# Patient Record
Sex: Male | Born: 1950
Health system: Southern US, Community
[De-identification: ages and names within clinical notes are randomized; demographics above are authoritative.]

---

## 2017-02-27 ENCOUNTER — Observation Stay (HOSPITAL_COMMUNITY)
Admission: EM | Admit: 2017-02-27 | Discharge: 2017-02-28 | Disposition: A | Discharge status: Home / Self Care | Payer: Medicare Other | Attending: Family Medicine | Admitting: Family Medicine

## 2017-02-27 ENCOUNTER — Encounter (HOSPITAL_COMMUNITY): Payer: Self-pay

## 2017-02-27 DIAGNOSIS — R55 Syncope and collapse: Secondary | ICD-10-CM | POA: Diagnosis present

## 2017-02-27 DIAGNOSIS — Y92410 Unspecified street and highway as the place of occurrence of the external cause: Secondary | ICD-10-CM | POA: Insufficient documentation

## 2017-02-27 DIAGNOSIS — R740 Nonspecific elevation of levels of transaminase and lactic acid dehydrogenase [LDH]: Secondary | ICD-10-CM | POA: Diagnosis not present

## 2017-02-27 DIAGNOSIS — F17210 Nicotine dependence, cigarettes, uncomplicated: Secondary | ICD-10-CM | POA: Diagnosis not present

## 2017-02-27 DIAGNOSIS — Y9389 Activity, other specified: Secondary | ICD-10-CM | POA: Insufficient documentation

## 2017-02-27 DIAGNOSIS — Y999 Unspecified external cause status: Secondary | ICD-10-CM | POA: Insufficient documentation

## 2017-02-27 DIAGNOSIS — Z5181 Encounter for therapeutic drug level monitoring: Secondary | ICD-10-CM | POA: Insufficient documentation

## 2017-02-27 DIAGNOSIS — R7989 Other specified abnormal findings of blood chemistry: Secondary | ICD-10-CM

## 2017-02-27 DIAGNOSIS — F1910 Other psychoactive substance abuse, uncomplicated: Secondary | ICD-10-CM | POA: Insufficient documentation
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
## 2017-02-28 ENCOUNTER — Observation Stay (HOSPITAL_COMMUNITY)
Admit: 2017-02-28 | Discharge: 2017-02-28 | Disposition: A | Discharge status: Home / Self Care | Payer: Medicare Other | Attending: Family Medicine | Admitting: Family Medicine

## 2017-02-28 ENCOUNTER — Emergency Department (HOSPITAL_COMMUNITY): Payer: Medicare Other

## 2017-02-28 ENCOUNTER — Encounter (HOSPITAL_COMMUNITY): Payer: Self-pay | Admitting: *Deleted

## 2017-02-28 ENCOUNTER — Observation Stay (HOSPITAL_BASED_OUTPATIENT_CLINIC_OR_DEPARTMENT_OTHER): Payer: Medicare Other

## 2017-02-28 DIAGNOSIS — R55 Syncope and collapse: Secondary | ICD-10-CM | POA: Diagnosis not present

## 2017-02-28 DIAGNOSIS — F1910 Other psychoactive substance abuse, uncomplicated: Secondary | ICD-10-CM | POA: Diagnosis not present

## 2017-02-28 LAB — LACTIC ACID, PLASMA
LACTIC ACID, VENOUS: 2 mmol/L — AB (ref 0.5–1.9)
Lactic Acid, Venous: 3.8 mmol/L (ref 0.5–1.9)

## 2017-02-28 LAB — TROPONIN I
Troponin I: <0.03 ng/mL (ref <0.03)
Troponin I: <0.03 ng/mL (ref <0.03)

## 2017-02-28 LAB — COMPREHENSIVE METABOLIC PANEL
ALBUMIN: 3.3 g/dL — AB (ref 3.5–5.0)
ALK PHOS: 70 U/L (ref 38–126)
ALT: 21 U/L (ref 17–63)
ALT: 25 U/L (ref 17–63)
ANION GAP: 12 (ref 5–15)
AST: 18 U/L (ref 15–41)
AST: 22 U/L (ref 15–41)
Albumin: 4 g/dL (ref 3.5–5.0)
Alkaline Phosphatase: 56 U/L (ref 38–126)
Anion gap: 8 (ref 5–15)
BILIRUBIN TOTAL: 0.3 mg/dL (ref 0.3–1.2)
BUN: 10 mg/dL (ref 6–20)
BUN: 9 mg/dL (ref 6–20)
CHLORIDE: 106 mmol/L (ref 101–111)
CHLORIDE: 112 mmol/L — AB (ref 101–111)
CO2: 22 mmol/L (ref 22–32)
CO2: 23 mmol/L (ref 22–32)
CREATININE: 0.93 mg/dL (ref 0.61–1.24)
CREATININE: 1.05 mg/dL (ref 0.61–1.24)
Calcium: 8.6 mg/dL — ABNORMAL LOW (ref 8.9–10.3)
Calcium: 9.3 mg/dL (ref 8.9–10.3)
GFR calc Af Amer: >60 mL/min (ref >60)
GFR calc Af Amer: >60 mL/min (ref >60)
GFR calc non Af Amer: >60 mL/min (ref >60)
GLUCOSE: 88 mg/dL (ref 65–99)
Glucose, Bld: 87 mg/dL (ref 65–99)
POTASSIUM: 3.9 mmol/L (ref 3.5–5.1)
Potassium: 3.5 mmol/L (ref 3.5–5.1)
Sodium: 141 mmol/L (ref 135–145)
Sodium: 142 mmol/L (ref 135–145)
Total Bilirubin: 0.2 mg/dL — ABNORMAL LOW (ref 0.3–1.2)
Total Protein: 6.4 g/dL — ABNORMAL LOW (ref 6.5–8.1)
Total Protein: 7.7 g/dL (ref 6.5–8.1)

## 2017-02-28 LAB — CBC WITH DIFFERENTIAL/PLATELET
BASOS ABS: 0 10*3/uL (ref 0.0–0.1)
Basophils Relative: 0 %
EOS ABS: 0 10*3/uL (ref 0.0–0.7)
Eosinophils Relative: 1 %
HCT: 45.7 % (ref 39.0–52.0)
HEMOGLOBIN: 15.4 g/dL (ref 13.0–17.0)
LYMPHS ABS: 1.6 10*3/uL (ref 0.7–4.0)
LYMPHS PCT: 24 %
MCH: 29.7 pg (ref 26.0–34.0)
MCHC: 33.7 g/dL (ref 30.0–36.0)
MCV: 88.2 fL (ref 78.0–100.0)
Monocytes Absolute: 0.3 10*3/uL (ref 0.1–1.0)
Monocytes Relative: 5 %
NEUTROS PCT: 70 %
Neutro Abs: 4.6 10*3/uL (ref 1.7–7.7)
Platelets: 238 10*3/uL (ref 150–400)
RBC: 5.18 MIL/uL (ref 4.22–5.81)
RDW: 13.8 % (ref 11.5–15.5)
WBC: 6.5 10*3/uL (ref 4.0–10.5)

## 2017-02-28 LAB — ECHOCARDIOGRAM COMPLETE
AVLVOTPG: 4 mmHg
CHL CUP MV DEC (S): 246
CHL CUP STROKE VOLUME: 42 mL
E decel time: 246 msec
E/e' ratio: 5.23
FS: 50 % — AB (ref 28–44)
Height: 70 in
IV/PV OW: 1.03
LA ID, A-P, ES: 29 mm
LA vol index: 25.8 mL/m2
LADIAMINDEX: 1.69 cm/m2
LAVOL: 44.2 mL
LAVOLA4C: 34.4 mL
LEFT ATRIUM END SYS DIAM: 29 mm
LV SIMPSON'S DISK: 61
LV TDI E'MEDIAL: 8.38
LV e' LATERAL: 13.5 cm/s
LV sys vol index: 16 mL/m2
LV sys vol: 27 mL
LVDIAVOL: 69 mL (ref 62–150)
LVDIAVOLIN: 40 mL/m2
LVEEAVG: 5.23
LVEEMED: 5.23
LVOT SV: 72 mL
LVOT VTI: 23 cm
LVOT area: 3.14 cm2
LVOT peak vel: 105 cm/s
LVOTD: 20 mm
Lateral S' vel: 14.1 cm/s
MVPKAVEL: 97.5 m/s
MVPKEVEL: 70.6 m/s
PW: 10.9 mm — AB (ref 0.6–1.1)
RV sys press: 27 mmHg
Reg peak vel: 247 cm/s
TAPSE: 28.1 mm
TDI e' lateral: 13.5
TRMAXVEL: 247 cm/s
Weight: 2127 oz

## 2017-02-28 LAB — URINALYSIS, ROUTINE W REFLEX MICROSCOPIC
Bilirubin Urine: NEGATIVE
Glucose, UA: NEGATIVE mg/dL
Hgb urine dipstick: NEGATIVE
Ketones, ur: NEGATIVE mg/dL
LEUKOCYTES UA: NEGATIVE
Nitrite: NEGATIVE
PROTEIN: NEGATIVE mg/dL
SPECIFIC GRAVITY, URINE: 1.01 (ref 1.005–1.030)
pH: 5 (ref 5.0–8.0)

## 2017-02-28 LAB — RAPID URINE DRUG SCREEN, HOSP PERFORMED
AMPHETAMINES: NOT DETECTED
BENZODIAZEPINES: NOT DETECTED
Barbiturates: NOT DETECTED
COCAINE: POSITIVE — AB
OPIATES: NOT DETECTED
TETRAHYDROCANNABINOL: POSITIVE — AB

## 2017-02-28 LAB — CBC
HEMATOCRIT: 40.4 % (ref 39.0–52.0)
Hemoglobin: 13.4 g/dL (ref 13.0–17.0)
MCH: 29.3 pg (ref 26.0–34.0)
MCHC: 33.2 g/dL (ref 30.0–36.0)
MCV: 88.2 fL (ref 78.0–100.0)
PLATELETS: 232 10*3/uL (ref 150–400)
RBC: 4.58 MIL/uL (ref 4.22–5.81)
RDW: 14 % (ref 11.5–15.5)
WBC: 7.7 10*3/uL (ref 4.0–10.5)

## 2017-02-28 LAB — ETHANOL: Alcohol, Ethyl (B): 117 mg/dL — ABNORMAL HIGH (ref <5)

## 2017-02-28 MED ORDER — ONDANSETRON HCL 4 MG/2ML IJ SOLN: 4.0000 mg | Freq: Four times a day (QID) | INTRAMUSCULAR | Status: DC | PRN

## 2017-02-28 MED ORDER — ENOXAPARIN SODIUM 40 MG/0.4ML ~~LOC~~ SOLN
40.0000 mg | SUBCUTANEOUS | Status: DC
  Administered 2017-02-28: 40 mg via SUBCUTANEOUS
  Filled 2017-02-28: qty 0.4

## 2017-02-28 MED ORDER — ACETAMINOPHEN 650 MG RE SUPP: 650.0000 mg | Freq: Four times a day (QID) | RECTAL | Status: DC | PRN

## 2017-02-28 MED ORDER — ACETAMINOPHEN 325 MG PO TABS: 650.0000 mg | ORAL_TABLET | Freq: Four times a day (QID) | ORAL | Status: DC | PRN

## 2017-02-28 MED ORDER — SODIUM CHLORIDE 0.9 % IV SOLN
INTRAVENOUS | Status: DC
  Administered 2017-02-28: 04:00:00 via INTRAVENOUS

## 2017-02-28 MED ORDER — SODIUM CHLORIDE 0.9 % IV BOLUS (SEPSIS)
1000.0000 mL | Freq: Once | INTRAVENOUS | Status: AC
  Administered 2017-02-28: 1000 mL via INTRAVENOUS

## 2017-02-28 MED ORDER — ONDANSETRON HCL 4 MG PO TABS: 4.0000 mg | ORAL_TABLET | Freq: Four times a day (QID) | ORAL | Status: DC | PRN

## 2017-02-28 NOTE — Progress Notes (Signed)
*  PRELIMINARY RESULTS* Echocardiogram 2D Echocardiogram has been performed.  Stacey Drain 02/28/2017, 9:32 AM

## 2017-02-28 NOTE — ED Notes (Signed)
CRITICAL VALUE ALERT  Critical value received: Lactic Acid- 3.8  Date of notification:  02/28/17  Time of notification:  0045  Critical value read back:Yes.    Nurse who received alert:  Marisa Hua, RN  MD notified (1st page):  Dr Clarene Duke  Time of first page:  0045  MD notified (2nd page):  Time of second page:  Responding MD: Dr Clarene Duke  Time MD responded:  [phone]

## 2017-02-28 NOTE — ED Notes (Signed)
Patient returned from CT/xray and is resting comfortably.

## 2017-02-28 NOTE — ED Notes (Signed)
Pt made aware of need for urine sample. Pt states that once he has drank some more of his water he will probably have to go. Pt informed to let nursing staff know when he is able

## 2017-02-28 NOTE — Progress Notes (Signed)
CRITICAL VALUE ALERT  Critical value received: Lactic Acid  Date of notification:  02/28/17  Time of notification:  0340  Critical value read back:Yes.    Nurse who received alert:  Cristela Blue  MD notified (1st page):  Drusilla Kanner  Time of first page:  [phone]  MD notified (2nd page):  Time of second page:  Responding MD:    Time MD responded:

## 2017-02-28 NOTE — Progress Notes (Signed)
Nutrition Brief Note  Patient identified on the Malnutrition Screening Tool (MST) Report  Patient presents to ED following syncopal episode. He denies appetite changes and /or unintentional wt loss. usual body weight 130's.   Wt Readings from Last 15 Encounters:  02/28/17 132 lb 15 oz (60.3 kg)    Body mass index is 19.07 kg/m. Patient meets criteria for normal range based on current BMI.   Current diet order is regular, patient is consuming approximately >75% of meals at this time per pt report. Feeds himself and denies chewing or swallow difficulty. Reports good food access and consumes 3 meals daily according to pt.   Labs and medications reviewed.   Recent Labs Lab 02/28/17 0002 02/28/17 0303  NA 141 142  K 3.5 3.9  CL 106 112*  CO2 23 22  BUN 10 9  CREATININE 1.05 0.93  CALCIUM 9.3 8.6*  GLUCOSE 87 88    Nutrition-Focused physical exam: WDL  No nutrition interventions warranted at this time. If nutrition issues arise, please consult RD.   Royann Shivers MS,RD,CSG,LDN Office: [phone] Pager: [phone]

## 2017-02-28 NOTE — ED Notes (Signed)
Patient transported to X-ray 

## 2017-02-28 NOTE — ED Notes (Signed)
Patient transported to X-ray & CT °

## 2017-02-28 NOTE — Procedures (Signed)
  [facility] Douglas Rooks A. Gerilyn Pilgrim, MD     [email or web address]           HISTORY: The patient is 66 year old man who presents with syncope. The studies being done to evaluate for seizures as a cause for these episodes.  MEDICATIONS: Scheduled Meds: Continuous Infusions: PRN Meds:.  Prior to Admission medications   Not on File      ANALYSIS: A 16 channel recording using standard 10 20 measurements is conducted for 21 minutes.  There is a well-formed posterior dominant rhythm of 11-13 Hz which attenuates with eye opening. There is beta activity observing the frontal areas. Awake and sleep activities are observed. K complexes see spindles are recorded. Photic stimulation is carried out without abnormal changes in the background activity. There is no focal or lateral slowing. There is no epileptiform activity observed.   IMPRESSION: This is a normal recording awake and sleep states.      Sanah Kraska A. Gerilyn Pilgrim, M.D.  Diplomate, Biomedical engineer of Psychiatry and Neurology ( Neurology).
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
## 2017-02-28 NOTE — ED Notes (Signed)
Pt given water per Dr Clarene Duke okay

## 2017-02-28 NOTE — ED Notes (Signed)
Patient transported to CT 
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
## 2017-02-28 NOTE — Progress Notes (Signed)
EEG Completed; Results Pending  

## 2017-03-03 NOTE — Progress Notes (Signed)
Patient discharged home with instructions given on medications and follow up visits, patient verbalized understanding. Prescriptions sent with patient.Accompanied by staff to an awaiting vehicle.
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
